# Patient Record
Sex: Female | Born: 1975 | Race: White | Hispanic: No | Marital: Married | Smoking: Never smoker
Health system: Southern US, Community
[De-identification: ages and names within clinical notes are randomized; demographics above are authoritative.]

## PROBLEM LIST (undated history)

## (undated) DIAGNOSIS — J45909 Unspecified asthma, uncomplicated: Secondary | ICD-10-CM

---

## 2004-09-24 ENCOUNTER — Other Ambulatory Visit: Admission: RE | Admit: 2004-09-24 | Discharge: 2004-09-24 | Payer: Self-pay | Admitting: Internal Medicine

## 2005-09-30 ENCOUNTER — Other Ambulatory Visit: Admission: RE | Admit: 2005-09-30 | Discharge: 2005-09-30 | Payer: Self-pay | Admitting: Internal Medicine

## 2005-10-04 ENCOUNTER — Encounter: Admission: RE | Admit: 2005-10-04 | Discharge: 2005-10-04 | Payer: Self-pay | Admitting: Internal Medicine

## 2006-10-24 ENCOUNTER — Other Ambulatory Visit: Admission: RE | Admit: 2006-10-24 | Discharge: 2006-10-24 | Payer: Self-pay | Admitting: Internal Medicine

## 2007-11-25 ENCOUNTER — Inpatient Hospital Stay (HOSPITAL_COMMUNITY): Admission: AD | Admit: 2007-11-25 | Discharge: 2007-11-28 | Payer: Self-pay | Admitting: Obstetrics and Gynecology

## 2007-11-26 ENCOUNTER — Encounter (INDEPENDENT_AMBULATORY_CARE_PROVIDER_SITE_OTHER): Payer: Self-pay | Admitting: Obstetrics and Gynecology

## 2008-04-16 ENCOUNTER — Other Ambulatory Visit: Admission: RE | Admit: 2008-04-16 | Discharge: 2008-04-16 | Payer: Self-pay | Admitting: Obstetrics and Gynecology

## 2009-04-17 ENCOUNTER — Other Ambulatory Visit: Admission: RE | Admit: 2009-04-17 | Discharge: 2009-04-17 | Payer: Self-pay | Admitting: Obstetrics and Gynecology

## 2009-12-22 ENCOUNTER — Inpatient Hospital Stay (HOSPITAL_COMMUNITY): Admission: RE | Admit: 2009-12-22 | Discharge: 2009-12-24 | Payer: Self-pay | Admitting: Obstetrics and Gynecology

## 2009-12-22 ENCOUNTER — Encounter (INDEPENDENT_AMBULATORY_CARE_PROVIDER_SITE_OTHER): Payer: Self-pay | Admitting: Obstetrics and Gynecology

## 2011-02-14 LAB — CBC
HCT: 33.4 % — ABNORMAL LOW (ref 36.0–46.0)
HCT: 37.8 % (ref 36.0–46.0)
Hemoglobin: 11.4 g/dL — ABNORMAL LOW (ref 12.0–15.0)
Hemoglobin: 13 g/dL (ref 12.0–15.0)
MCHC: 34.2 g/dL (ref 30.0–36.0)
MCHC: 34.4 g/dL (ref 30.0–36.0)
MCV: 97 fL (ref 78.0–100.0)
MCV: 97.8 fL (ref 78.0–100.0)
Platelets: 190 10*3/uL (ref 150–400)
Platelets: 216 10*3/uL (ref 150–400)
RBC: 3.41 MIL/uL — ABNORMAL LOW (ref 3.87–5.11)
RBC: 3.89 MIL/uL (ref 3.87–5.11)
RDW: 14.3 % (ref 11.5–15.5)
RDW: 14.4 % (ref 11.5–15.5)
WBC: 15.4 10*3/uL — ABNORMAL HIGH (ref 4.0–10.5)
WBC: 16.7 10*3/uL — ABNORMAL HIGH (ref 4.0–10.5)

## 2011-02-14 LAB — URINALYSIS, ROUTINE W REFLEX MICROSCOPIC
Bilirubin Urine: NEGATIVE
Glucose, UA: NEGATIVE mg/dL
Hgb urine dipstick: NEGATIVE
Ketones, ur: 15 mg/dL — AB
Nitrite: NEGATIVE
Protein, ur: NEGATIVE mg/dL
Specific Gravity, Urine: 1.02 (ref 1.005–1.030)
Urobilinogen, UA: 0.2 mg/dL (ref 0.0–1.0)
pH: 6.5 (ref 5.0–8.0)

## 2011-02-14 LAB — TYPE AND SCREEN
ABO/RH(D): O NEG
Antibody Screen: POSITIVE
DAT, IgG: NEGATIVE

## 2011-02-14 LAB — URINE MICROSCOPIC-ADD ON: RBC / HPF: NONE SEEN RBC/hpf (ref <3)

## 2011-02-14 LAB — RPR: RPR Ser Ql: NONREACTIVE

## 2011-04-13 NOTE — Op Note (Signed)
NAMECARISMA, TROUPE            ACCOUNT NO.:  [id number]   MEDICAL RECORD NO.:  [id number]          PATIENT TYPE:  INP   LOCATION:  9104                          FACILITY:  [facility]   PHYSICIAN:  Charles A. Delcambre, MDDATE OF BIRTH:  October 23, 1976   DATE OF PROCEDURE:  11/26/2007  DATE OF DISCHARGE:                               OPERATIVE REPORT   PREOPERATIVE DIAGNOSES:  1. Arrest of dilation at 9.5 cm.  2. Chorioamnionitis.   POSTOPERATIVE DIAGNOSES:  1. Arrest of dilation at 9.5 cm.  2. Chorioamnionitis.  3. Nuchal cord x2, loose.   PROCEDURE:  Primary low transverse cesarean section.   SURGEON:  Charles A. Sydnee Cabal, M.D.   ASSISTANT:  None.   OPERATIVE FINDINGS:  Normal tubes and ovaries.  7 pound 12 ounce female.  Apgars and 8 and 9, vigorous.  The cord arterial blood gas 7.238, cord  venous blood gas 7.29.   SPECIMENS:  Placenta to pathology.   BLOOD LOSS:  600 mL.   COMPLICATIONS:  Nuchal cord x2.   COUNTS:  Instrument, sponge, and needle counts correct x2.   DESCRIPTION OF PROCEDURE:  The patient was taken to the operating room  and placed in the supine position.  Anesthesia was dosed and was  adequate.  Sterile prep and drape was accomplished.   A knife was used to make a Pfannenstiel incision two fingerbreadths  above the symphysis pubis.  The fascia was scored with a knife and then  cut with Mayo scissors.  Hemostasis was achieved with electrocautery.  The rectus muscles were bluntly dissected in the midline after the  rectus sheath had been released superiorly and inferiorly without  difficulty.  There was no damage to bowel or bladder evident.  An Alexis  retractor was placed for further exposure without difficulty.  Hand  sweep and did yield no bowel into the Alexis retractor.  The  vesicouterine peritoneum was then incised in transverse fashion,  dropping the bladder down and above this opening in the peritoneum.  Blunt dissection was used to develop the  bladder flap.  Lower uterine  segment transverse incision was made to amniotomy.  Clear amniotic fluid  was noted.  I could not detect an odor.  There was no damage to the  infant with entry.  A vertical traction was used to extend the incision.  A hand was inserted to carefully lift it out of the pelvis without  evidence of laceration.  Fundal pressure by the operator's assistant did  achieve delivery of the baby.  Once the baby's occiput was brought onto  the operative site, the baby was delivered without difficulty.  I did  have cord entanglement, but this was handled without problem by  delivering through.  Dr. Welford Roche was present for delivery and accepted  the baby after he was shown to the parents after being cut free.  Cord  gases were taken.  The placenta was then manually expressed and sent for  cord blood studies and two draw the blood for the laboratory tests on  the baby.  The transverse incision was then closed in two layers with  #  1 chromic.  Hemostasis was still to be had.  The second layer  imbricated over with #1 chromic yielded good hemostasis.  To better  define the bladder in a very thin lower uterine segment, sterile milk  was injected, and there was no evidence of injury to the bladder.  Several figure-of-eight 2-0 Vicryl sutures were placed to strengthen the  incision and stop bleeding areas, none of which were thought to be in  the bladder; however, the bladder was filled with sterile milk, and  there was no evidence of injury to the bladder.  Irrigation was carried  out.  Hemostasis was excellent.  The gutters were cleaned of clotted  blood and material, and the uterus was returned to the abdominal cavity,  having been externalized for repair.  Irrigation was carried out once  again of the paracolic gutters and bladder flap.  Hemostasis was  excellent.  Incisional hemostasis was excellent at this time.  Subfascial hemostasis was excellent.  Very minor  electrocautery at the  most caudal section the fascial release yielded good hemostasis.  The  fascia was closed with #1 Vicryl running nonlocking single stitch.  Subcutaneous irrigation was carried out.  Minor electrocautery was done  to achieve hemostasis in this layer.  The skin was then closed with  sterile skin clips.  A sterile dressing was applied.   The patient was taken to recovery with physician in attendance, having  tolerated the procedure well.      Charles A. Sydnee Cabal, MD  Electronically Signed     CAD/MEDQ  D:  11/26/2007  T:  11/27/2007  Job:  [id number]

## 2011-04-16 NOTE — Discharge Summary (Signed)
Krystal Townsend, Krystal Townsend            ACCOUNT NO.:  [id number]   MEDICAL RECORD NO.:  [id number]          PATIENT TYPE:  INP   LOCATION:  9104                          FACILITY:  [facility]   PHYSICIAN:  Gerald Leitz, MD          DATE OF BIRTH:  27-Jan-1976   DATE OF ADMISSION:  11/26/2007  DATE OF DISCHARGE:  11/28/2007                               DISCHARGE SUMMARY   ADMISSION DIAGNOSIS:  Term intrauterine pregnancy, active labor.   DISCHARGE DIAGNOSIS:  Term intrauterine pregnancy with failure to  progress, status post low transverse cesarean section.   HISTORY OF PRESENT ILLNESS:  The patient was admitted on November 26, 2007 at term in active labor.  She progressed to 9.5 cm with arrested  dilatation and underwent cesarean section.  She delivered a live-born  female infant with Apgar of 9 and 9 at 1 and 5 minutes respectively,  weight 3515 grams, length 20.5 inches.  The patient did well  postoperatively and was discharged home on postoperative day number 2.  Hemoglobin on postoperative day number 1 was 10.1, hematocrit of 28.4.  She was discharged home on the following medications.  Percocet, Motrin,  iron sulfate.  To follow up for staple removal.   CONDITION ON DISCHARGE:  Stable.      Gerald Leitz, MD  Electronically Signed     TC/MEDQ  D:  01/08/2008  T:  01/09/2008  Job:  [id number]

## 2011-09-03 LAB — CBC
HCT: 25.9 — ABNORMAL LOW
HCT: 28.4 — ABNORMAL LOW
HCT: 34.5 — ABNORMAL LOW
Hemoglobin: 10.1 — ABNORMAL LOW
Hemoglobin: 12.1
Hemoglobin: 9.1 — ABNORMAL LOW
MCHC: 35
MCHC: 35.1
MCHC: 35.5
MCV: 96.8
MCV: 97
MCV: 97.9
Platelets: 220
Platelets: 233
Platelets: 265
RBC: 2.64 — ABNORMAL LOW
RBC: 2.93 — ABNORMAL LOW
RBC: 3.55 — ABNORMAL LOW
RDW: 14
RDW: 14.4
RDW: 14.5
WBC: 15.6 — ABNORMAL HIGH
WBC: 16.6 — ABNORMAL HIGH
WBC: 20.7 — ABNORMAL HIGH

## 2011-09-03 LAB — DIFFERENTIAL
Basophils Absolute: 0
Basophils Relative: 0
Eosinophils Absolute: 0.2
Eosinophils Relative: 1
Lymphocytes Relative: 16
Lymphs Abs: 2.7
Monocytes Absolute: 0.8
Monocytes Relative: 5
Neutro Abs: 12.9 — ABNORMAL HIGH
Neutrophils Relative %: 78 — ABNORMAL HIGH

## 2011-09-03 LAB — RH IMMUNE GLOB WKUP(>/=20WKS)(NOT WOMEN'S HOSP): Fetal Screen: NEGATIVE

## 2011-09-03 LAB — RPR: RPR Ser Ql: NONREACTIVE

## 2014-09-17 ENCOUNTER — Other Ambulatory Visit: Payer: Self-pay | Admitting: Family Medicine

## 2014-09-17 DIAGNOSIS — R200 Anesthesia of skin: Secondary | ICD-10-CM

## 2014-09-17 DIAGNOSIS — R202 Paresthesia of skin: Principal | ICD-10-CM

## 2014-09-21 ENCOUNTER — Ambulatory Visit
Admission: RE | Admit: 2014-09-21 | Discharge: 2014-09-21 | Disposition: A | Discharge status: Home / Self Care | Payer: BC Managed Care – PPO | Source: Ambulatory Visit | Attending: Family Medicine | Admitting: Family Medicine

## 2014-09-21 DIAGNOSIS — R202 Paresthesia of skin: Principal | ICD-10-CM

## 2014-09-21 DIAGNOSIS — R200 Anesthesia of skin: Secondary | ICD-10-CM

## 2016-02-20 ENCOUNTER — Encounter (HOSPITAL_BASED_OUTPATIENT_CLINIC_OR_DEPARTMENT_OTHER): Payer: Self-pay | Admitting: *Deleted

## 2016-02-20 ENCOUNTER — Emergency Department (HOSPITAL_BASED_OUTPATIENT_CLINIC_OR_DEPARTMENT_OTHER): Payer: BC Managed Care – PPO

## 2016-02-20 ENCOUNTER — Emergency Department (HOSPITAL_BASED_OUTPATIENT_CLINIC_OR_DEPARTMENT_OTHER)
Admission: EM | Admit: 2016-02-20 | Discharge: 2016-02-20 | Disposition: A | Discharge status: Home / Self Care | Payer: BC Managed Care – PPO | Attending: Emergency Medicine | Admitting: Emergency Medicine

## 2016-02-20 DIAGNOSIS — J45909 Unspecified asthma, uncomplicated: Secondary | ICD-10-CM | POA: Insufficient documentation

## 2016-02-20 DIAGNOSIS — R079 Chest pain, unspecified: Secondary | ICD-10-CM | POA: Diagnosis present

## 2016-02-20 DIAGNOSIS — Z3202 Encounter for pregnancy test, result negative: Secondary | ICD-10-CM | POA: Insufficient documentation

## 2016-02-20 DIAGNOSIS — M545 Low back pain: Secondary | ICD-10-CM | POA: Insufficient documentation

## 2016-02-20 DIAGNOSIS — R091 Pleurisy: Secondary | ICD-10-CM | POA: Insufficient documentation

## 2016-02-20 DIAGNOSIS — Z79899 Other long term (current) drug therapy: Secondary | ICD-10-CM | POA: Insufficient documentation

## 2016-02-20 HISTORY — DX: Unspecified asthma, uncomplicated: J45.909

## 2016-02-20 LAB — CBC WITH DIFFERENTIAL/PLATELET
Basophils Absolute: 0 10*3/uL (ref 0.0–0.1)
Basophils Relative: 0 %
Eosinophils Absolute: 0.2 10*3/uL (ref 0.0–0.7)
Eosinophils Relative: 2 %
HEMATOCRIT: 36.7 % (ref 36.0–46.0)
HEMOGLOBIN: 12.1 g/dL (ref 12.0–15.0)
Lymphocytes Relative: 25 %
Lymphs Abs: 2.4 10*3/uL (ref 0.7–4.0)
MCH: 30 pg (ref 26.0–34.0)
MCHC: 33 g/dL (ref 30.0–36.0)
MCV: 90.8 fL (ref 78.0–100.0)
Monocytes Absolute: 0.5 10*3/uL (ref 0.1–1.0)
Monocytes Relative: 5 %
NEUTROS ABS: 6.6 10*3/uL (ref 1.7–7.7)
Neutrophils Relative %: 68 %
Platelets: 257 10*3/uL (ref 150–400)
RBC: 4.04 MIL/uL (ref 3.87–5.11)
RDW: 13.2 % (ref 11.5–15.5)
WBC: 9.7 10*3/uL (ref 4.0–10.5)

## 2016-02-20 LAB — BASIC METABOLIC PANEL
ANION GAP: 7 (ref 5–15)
BUN: 8 mg/dL (ref 6–20)
CO2: 27 mmol/L (ref 22–32)
Calcium: 8.5 mg/dL — ABNORMAL LOW (ref 8.9–10.3)
Chloride: 104 mmol/L (ref 101–111)
Creatinine, Ser: 0.58 mg/dL (ref 0.44–1.00)
GFR calc Af Amer: >60 mL/min (ref >60)
GFR calc non Af Amer: >60 mL/min (ref >60)
Glucose, Bld: 105 mg/dL — ABNORMAL HIGH (ref 65–99)
Potassium: 3.1 mmol/L — ABNORMAL LOW (ref 3.5–5.1)
Sodium: 138 mmol/L (ref 135–145)

## 2016-02-20 LAB — D-DIMER, QUANTITATIVE: D-Dimer, Quant: 0.37 ug/mL-FEU (ref 0.00–0.50)

## 2016-02-20 LAB — HCG, QUANTITATIVE, PREGNANCY: hCG, Beta Chain, Quant, S: <1 m[IU]/mL (ref <5)

## 2016-02-20 MED ORDER — POTASSIUM CHLORIDE CRYS ER 20 MEQ PO TBCR
40.0000 meq | EXTENDED_RELEASE_TABLET | Freq: Once | ORAL | Status: AC
  Administered 2016-02-20: 40 meq via ORAL
  Filled 2016-02-20: qty 2

## 2016-02-20 NOTE — Discharge Instructions (Signed)
Pleurisy  Pleurisy is an inflammation and swelling of the lining of the lungs (pleura). Because of this inflammation, it hurts to breathe. It can be aggravated by coughing, laughing, or deep breathing. Pleurisy is often caused by an underlying infection or disease.   HOME CARE INSTRUCTIONS   Monitor your pleurisy for any changes. The following actions may help to alleviate any discomfort you are experiencing:  · Medicine may help with pain. Only take over-the-counter or prescription medicines for pain, discomfort, or fever as directed by your health care provider.  · Only take antibiotic medicine as directed. Make sure to finish it even if you start to feel better.  SEEK MEDICAL CARE IF:   · Your pain is not controlled with medicine or is increasing.  · You have an increase in pus-like (purulent) secretions brought up with coughing.  SEEK IMMEDIATE MEDICAL CARE IF:   · You have blue or dark lips, fingernails, or toenails.  · You are coughing up blood.  · You have increased difficulty breathing.  · You have continuing pain unrelieved by medicine or pain lasting more than 1 week.  · You have pain that radiates into your neck, arms, or jaw.  · You develop increased shortness of breath or wheezing.  · You develop a fever, rash, vomiting, fainting, or other serious symptoms.  MAKE SURE YOU:  · Understand these instructions.    · Will watch your condition.    · Will get help right away if you are not doing well or get worse.        This information is not intended to replace advice given to you by your health care provider. Make sure you discuss any questions you have with your health care provider.     Document Released: 11/15/2005 Document Revised: 07/18/2013 Document Reviewed: 04/29/2013  Elsevier Interactive Patient Education ©2016 [employer]

## 2016-02-20 NOTE — ED Notes (Signed)
Pt placed on automatic VS q30

## 2016-02-20 NOTE — ED Notes (Signed)
EKG electrodes removed immediately after waves captured due to pt breaking out in small red splotches under them. Pt states that happens with a lot of band aids, tapes, etc.

## 2016-02-20 NOTE — ED Provider Notes (Signed)
CSN: [id number]     Arrival date & time 02/20/16  1128 History   First MD Initiated Contact with Patient 02/20/16 1129     Chief Complaint  Patient presents with  . Back Pain     (Consider location/radiation/quality/duration/timing/severity/associated sxs/prior Treatment) HPI   40 year old female with history of asthma presents for evaluation of pleuritic chest pain. Patient states for the past week she has noticed pain to the left side of the chest when she takes a deep breath. Her pain is mild to moderate, intermittent, only presents with taking deep breath. There is no associated fever, lightheadedness, dizziness, shortness of breath, productive cough, hemoptysis, back pain, abdominal pain, or rash. She denies having any nausea vomiting or diarrhea or any postprandial pain. She denies any recent strenuous activities or any recent injury. She denies any prior history of PE or DVT, no recent surgery, prolonged bed rest, unilateral leg swelling or calf pain, active cancer or hemoptysis. She was on oral birth control but discontinued several months ago. She did follow-up with her PCP today for this complaint and was sent to the ER with requests for further evaluation of pleuritic chest pain. She is currently pain-free at the moment. Furthermore she does not have any URI symptoms.  Past Medical History  Diagnosis Date  . Asthma    Past Surgical History  Procedure Laterality Date  . Cesarean section     History reviewed. No pertinent family history. Social History  Substance Use Topics  . Smoking status: Never Smoker   . Smokeless tobacco: None  . Alcohol Use: No   OB History    No data available     Review of Systems  All other systems reviewed and are negative.     Allergies  Menthol and Adhesive  Home Medications   Prior to Admission medications   Medication Sig Start Date End Date Taking? Authorizing Provider  albuterol (PROVENTIL HFA;VENTOLIN HFA) 108 (90 Base) MCG/ACT  inhaler Inhale into the lungs every 6 (six) hours as needed for wheezing or shortness of breath.   Yes Historical Provider, MD  cetirizine (ZYRTEC) 10 MG tablet Take 10 mg by mouth daily.   Yes Historical Provider, MD  montelukast (SINGULAIR) 10 MG tablet Take 10 mg by mouth at bedtime.   Yes Historical Provider, MD   BP 123/92 mmHg  Pulse 84  Temp(Src) 98.8 F (37.1 C) (Oral)  Resp 22  Ht 5\' 1"  (1.549 m)  Wt 83.008 kg  BMI 34.60 kg/m2  SpO2 99%  LMP 02/20/2016 Physical Exam  Constitutional: She appears well-developed and well-nourished. No distress.  HENT:  Head: Atraumatic.  Eyes: Conjunctivae are normal.  Neck: Neck supple. No tracheal deviation present.  Cardiovascular: Normal rate and regular rhythm.   Pulmonary/Chest: Effort normal and breath sounds normal. No respiratory distress. She has no wheezes. She has no rales. She exhibits no tenderness.  Abdominal: Soft. There is no tenderness.  Musculoskeletal: She exhibits no edema or tenderness.  Neurological: She is alert.  Skin: No rash noted.  Psychiatric: She has a normal mood and affect.  Nursing note and vitals reviewed.   ED Course  Procedures (including critical care time) Labs Review Labs Reviewed  BASIC METABOLIC PANEL - Abnormal; Notable for the following:    Potassium 3.1 (*)    Glucose, Bld 105 (*)    Calcium 8.5 (*)    All other components within normal limits  CBC WITH DIFFERENTIAL/PLATELET  D-DIMER, QUANTITATIVE (NOT [facility])  HCG, QUANTITATIVE, PREGNANCY  Imaging Review Dg Chest 2 View  02/20/2016  CLINICAL DATA:  Left-sided chest pain for 6 days EXAM: CHEST  2 VIEW COMPARISON:  None. FINDINGS: The heart size and mediastinal contours are within normal limits. Both lungs are clear. The visualized skeletal structures are unremarkable. IMPRESSION: No active cardiopulmonary disease. Electronically Signed   By: Esperanza Heir M.D.   On: 02/20/2016 11:59   I have personally reviewed and evaluated these  images and lab results as part of my medical decision-making.   EKG Interpretation None      MDM   Final diagnoses:  Pleurisy    BP 123/92 mmHg  Pulse 84  Temp(Src) 98.8 F (37.1 C) (Oral)  Resp 22  Ht  (1.549 m)  Wt 83.008 kg  BMI 34.60 kg/m2  SpO2 99%  LMP 02/20/2016   11:51 AM Patient presents with pleuritic chest pain when taking deep breath. She does not have any significant risk factors for PE however she was recommended to be seen in the ED by her PCP to rule out for potential pulmonary embolism. She is well appearing, in no acute discomfort any no respiratory discomfort. She does not have any reproducible chest wall pain. Workup initiated including a d-dimer.  1:35 PM D-dimer is normal, normal pregnancy test, chest x-ray is unremarkable, labs are reassuring except mild hypokalemia with potassium of 3.1, supplementation provided in the ED. Recommend patient to eat banana to help replenish her potassium. Otherwise she can follow-up with her PCP for further care. No suspicion for PE. Return precaution discussed.  Fayrene Helper, PA-C 02/20/16 1336  Geoffery Lyons, MD 02/20/16 [phone]

## 2016-02-20 NOTE — ED Notes (Signed)
Per pt report started lt side back/flank pain with deep breath last Saturday. No fever/n//d.

## 2017-09-23 IMAGING — DX DG CHEST 2V
2 series · 2 of 2 positions shown · non-contrast
Comparison: None.

CLINICAL DATA: Left-sided chest pain for 6 days

EXAM:
CHEST  2 VIEW

[chest pa]
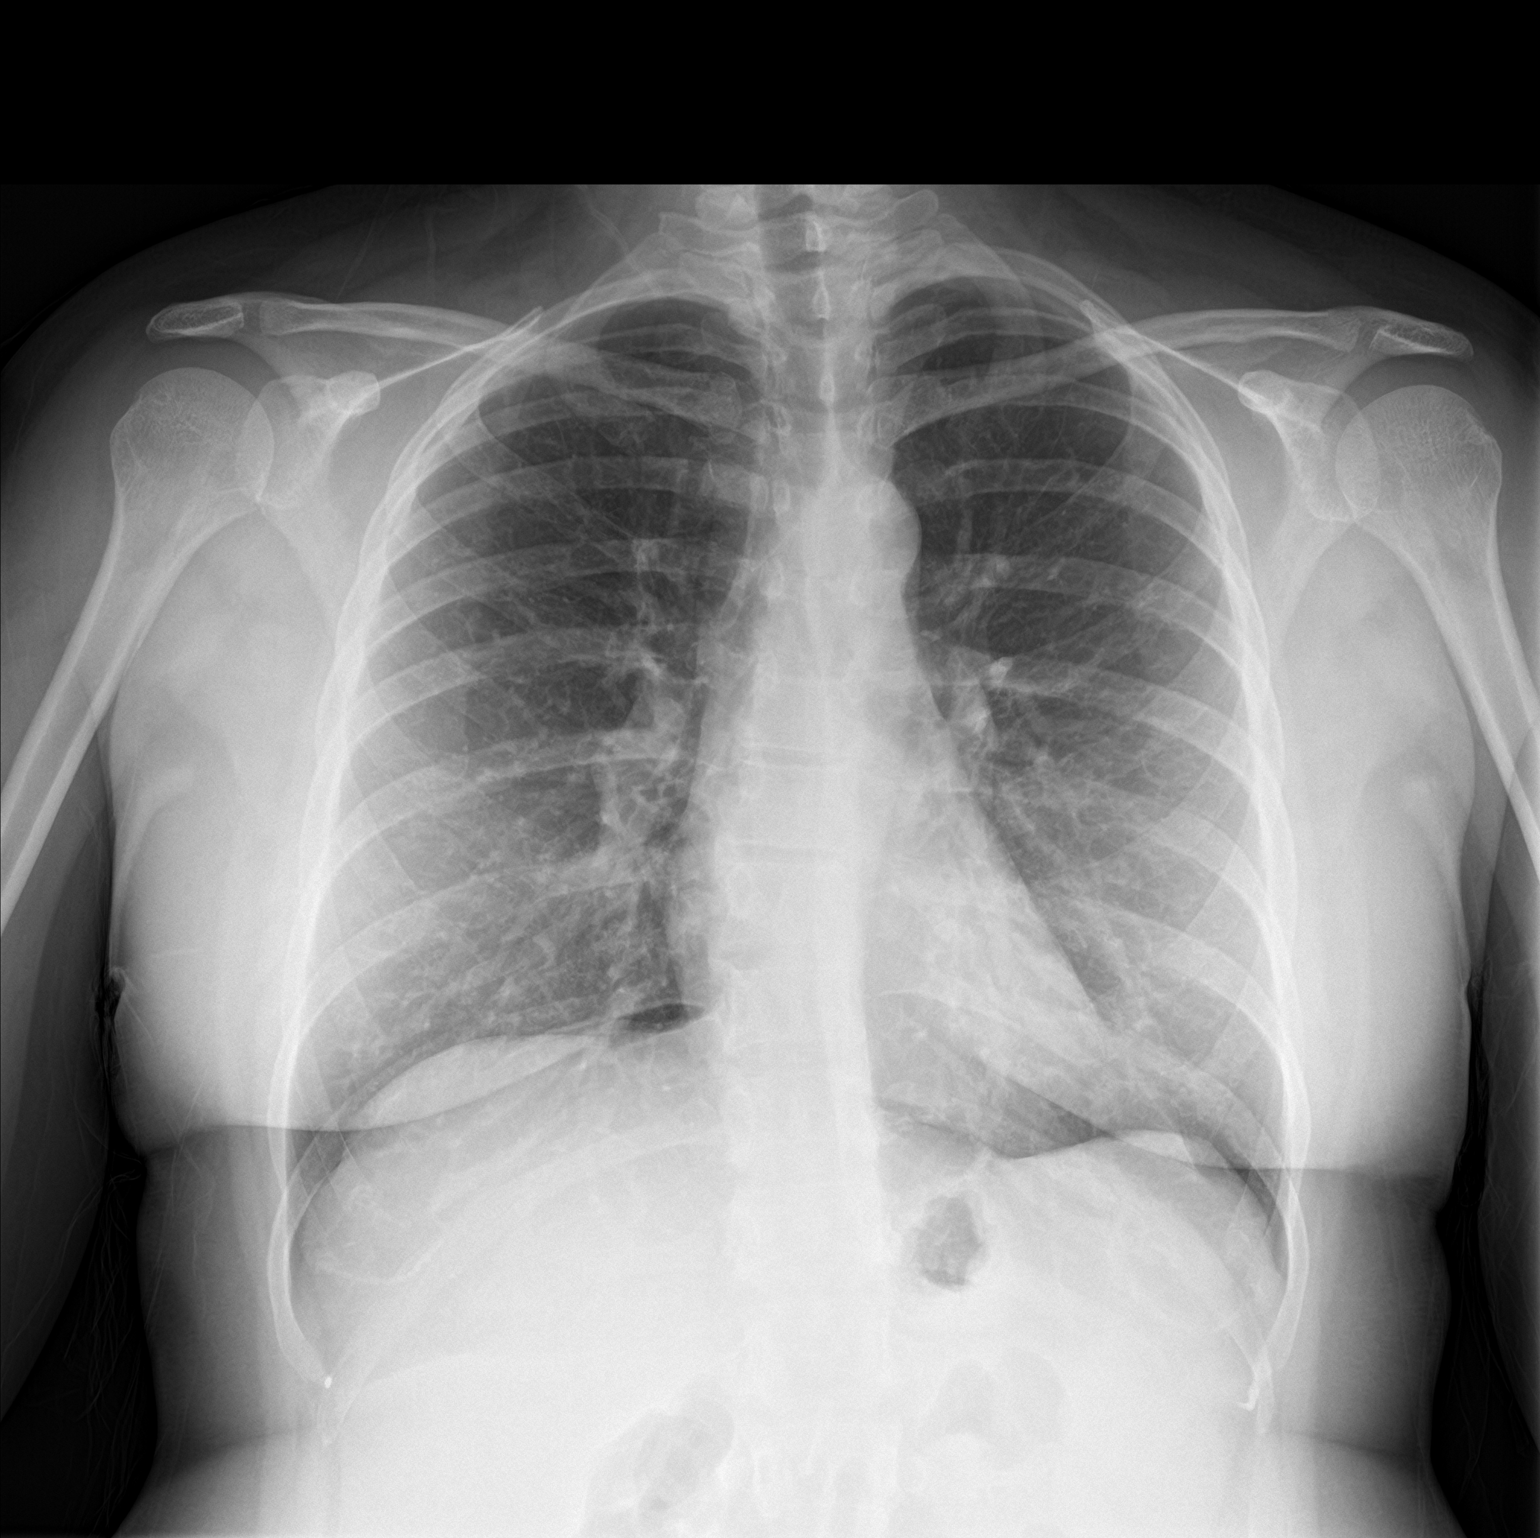

[chest lat]
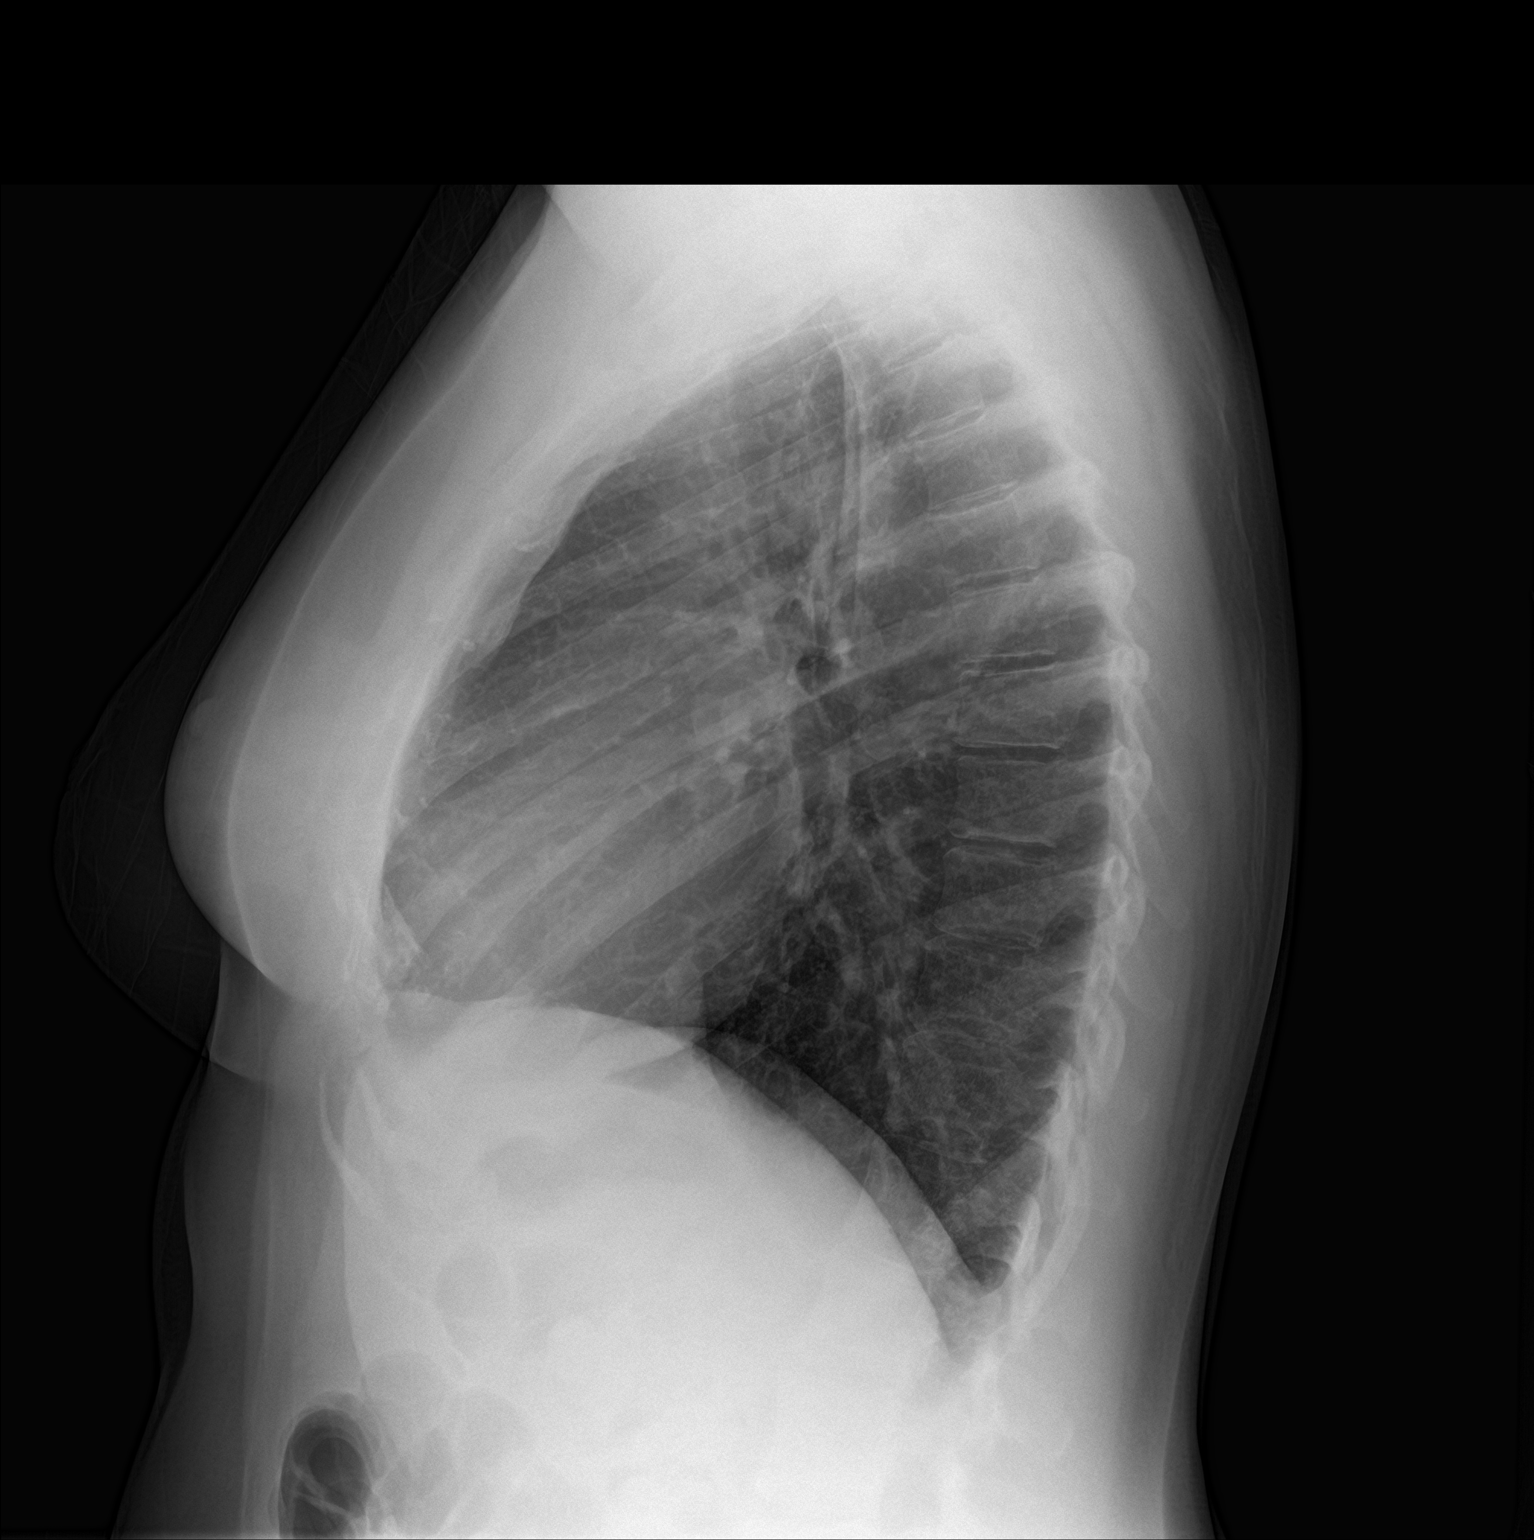

[2 of 2 positions shown; findings below may reference images not displayed]

FINDINGS: The heart size and mediastinal contours are within normal limits.
Both lungs are clear. The visualized skeletal structures are
unremarkable.
IMPRESSION: No active cardiopulmonary disease.

## 2023-09-13 ENCOUNTER — Telehealth (INDEPENDENT_AMBULATORY_CARE_PROVIDER_SITE_OTHER): Payer: Self-pay | Admitting: Otolaryngology

## 2023-09-13 ENCOUNTER — Ambulatory Visit (INDEPENDENT_AMBULATORY_CARE_PROVIDER_SITE_OTHER): Payer: BC Managed Care – PPO

## 2023-09-13 NOTE — Telephone Encounter (Signed)
Called and left voicemail to confirm [facility] address with patient.
